# Patient Record
Sex: Female | Born: 2000 | Race: Black or African American | Hispanic: No | Marital: Single | Smoking: Never smoker
Health system: Southern US, Community
[De-identification: ages and names within clinical notes are randomized; demographics above are authoritative.]

## PROBLEM LIST (undated history)

## (undated) DIAGNOSIS — R569 Unspecified convulsions: Secondary | ICD-10-CM

## (undated) DIAGNOSIS — F79 Unspecified intellectual disabilities: Secondary | ICD-10-CM

## (undated) DIAGNOSIS — L309 Dermatitis, unspecified: Secondary | ICD-10-CM

## (undated) DIAGNOSIS — D649 Anemia, unspecified: Secondary | ICD-10-CM

## (undated) HISTORY — DX: Unspecified intellectual disabilities: F79

## (undated) HISTORY — DX: Dermatitis, unspecified: L30.9

## (undated) HISTORY — DX: Unspecified convulsions: R56.9

## (undated) HISTORY — DX: Anemia, unspecified: D64.9

---
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
## 2023-05-17 ENCOUNTER — Inpatient Hospital Stay: Payer: MEDICAID | Attending: Hematology and Oncology | Admitting: Hematology and Oncology

## 2023-05-17 ENCOUNTER — Encounter: Payer: Self-pay | Admitting: Hematology and Oncology

## 2023-05-17 ENCOUNTER — Inpatient Hospital Stay: Payer: MEDICAID

## 2023-05-17 VITALS — BP 123/75 | HR 108 | Temp 97.8°F | Resp 18

## 2023-05-17 DIAGNOSIS — F71 Moderate intellectual disabilities: Secondary | ICD-10-CM | POA: Diagnosis not present

## 2023-05-17 DIAGNOSIS — N924 Excessive bleeding in the premenopausal period: Secondary | ICD-10-CM

## 2023-05-17 DIAGNOSIS — D539 Nutritional anemia, unspecified: Secondary | ICD-10-CM

## 2023-05-17 DIAGNOSIS — N920 Excessive and frequent menstruation with regular cycle: Secondary | ICD-10-CM | POA: Diagnosis not present

## 2023-05-17 DIAGNOSIS — D75838 Other thrombocytosis: Secondary | ICD-10-CM | POA: Diagnosis not present

## 2023-05-17 DIAGNOSIS — Z79899 Other long term (current) drug therapy: Secondary | ICD-10-CM | POA: Insufficient documentation

## 2023-05-17 DIAGNOSIS — D500 Iron deficiency anemia secondary to blood loss (chronic): Secondary | ICD-10-CM

## 2023-05-17 LAB — ABO/RH: ABO/RH(D): O POS

## 2023-05-17 LAB — CBC WITH DIFFERENTIAL (CANCER CENTER ONLY)
Abs Immature Granulocytes: 0.01 10*3/uL (ref 0.00–0.07)
Basophils Absolute: 0.1 10*3/uL (ref 0.0–0.1)
Basophils Relative: 1 %
Eosinophils Absolute: 0.2 10*3/uL (ref 0.0–0.5)
Eosinophils Relative: 4 %
HCT: 24.9 % — ABNORMAL LOW (ref 36.0–46.0)
Hemoglobin: 6.7 g/dL — CL (ref 12.0–15.0)
Immature Granulocytes: 0 %
Lymphocytes Relative: 28 %
Lymphs Abs: 1.6 10*3/uL (ref 0.7–4.0)
MCH: 17.5 pg — ABNORMAL LOW (ref 26.0–34.0)
MCHC: 26.9 g/dL — ABNORMAL LOW (ref 30.0–36.0)
MCV: 65.2 fL — ABNORMAL LOW (ref 80.0–100.0)
Monocytes Absolute: 0.7 10*3/uL (ref 0.1–1.0)
Monocytes Relative: 13 %
Neutro Abs: 3 10*3/uL (ref 1.7–7.7)
Neutrophils Relative %: 54 %
Platelet Count: 483 10*3/uL — ABNORMAL HIGH (ref 150–400)
RBC: 3.82 MIL/uL — ABNORMAL LOW (ref 3.87–5.11)
RDW: 21.2 % — ABNORMAL HIGH (ref 11.5–15.5)
WBC Count: 5.5 10*3/uL (ref 4.0–10.5)
nRBC: 0 % (ref 0.0–0.2)

## 2023-05-17 LAB — SAMPLE TO BLOOD BANK

## 2023-05-17 LAB — CMP (CANCER CENTER ONLY)
ALT: 24 U/L (ref 0–44)
AST: 30 U/L (ref 15–41)
Albumin: 3.8 g/dL (ref 3.5–5.0)
Alkaline Phosphatase: 82 U/L (ref 38–126)
Anion gap: 6 (ref 5–15)
BUN: 6 mg/dL (ref 6–20)
CO2: 26 mmol/L (ref 22–32)
Calcium: 9.1 mg/dL (ref 8.9–10.3)
Chloride: 107 mmol/L (ref 98–111)
Creatinine: 0.41 mg/dL — ABNORMAL LOW (ref 0.44–1.00)
GFR, Estimated: >60 mL/min (ref >60)
Glucose, Bld: 80 mg/dL (ref 70–99)
Potassium: 3.8 mmol/L (ref 3.5–5.1)
Sodium: 139 mmol/L (ref 135–145)
Total Bilirubin: 0.2 mg/dL (ref <1.2)
Total Protein: 7.5 g/dL (ref 6.5–8.1)

## 2023-05-17 LAB — PREPARE RBC (CROSSMATCH)

## 2023-05-17 NOTE — Assessment & Plan Note (Addendum)
The cause of her iron deficiency anemia is due to menorrhagia Due to her history of seizure and severe anemia, I recommend blood transfusion followed by IV iron  We discussed some of the risks, benefits, and alternatives of blood transfusions. The patient is symptomatic from anemia and the hemoglobin level is critically low.  Some of the side-effects to be expected including risks of transfusion reactions, chills, infection, syndrome of volume overload and risk of hospitalization from various reasons and the patient's caregiver is willing to proceed and went ahead to sign consent today. I will arrange for 1 unit of blood tomorrow and IV iron infusion next month.  The most likely cause of her anemia is due to chronic blood loss/malabsorption syndrome. We discussed some of the risks, benefits, and alternatives of intravenous iron infusions. The patient is symptomatic from anemia and the iron level is critically low. She tolerated oral iron supplement poorly and desires to achieved higher levels of iron faster for adequate hematopoesis. Some of the side-effects to be expected including risks of infusion reactions, phlebitis, headaches, nausea and fatigue.  The patient's caregiver is willing to proceed. Patient education material was dispensed.  Goal is to keep ferritin level greater than 50 and resolution of anemia I plan to repeat labs again after she completes intravenous iron infusion

## 2023-05-17 NOTE — Assessment & Plan Note (Signed)
Likely due to severe iron deficiency anemia Observe closely I anticipate improvement in the future

## 2023-05-17 NOTE — Assessment & Plan Note (Signed)
I discussed risk and benefits of Lupron injection to stop her menstrual cycle with her caregiver, mother She ultimately agreed to proceed After Lupron injection, she can stop birth control pill In the future, we might want to consider hysterectomy

## 2023-05-17 NOTE — Assessment & Plan Note (Signed)
The patient is not able to give consent Her mother is the principal caregiver She has consented for blood, IV iron and Lupron injection Due to her frequent changes in posture, I have requested special accommodation for her to be transfused in an isolated room for privacy

## 2023-05-17 NOTE — Progress Notes (Signed)
[name] CONSULT NOTE  Patient Care Team: Christel Mormon, MD as PCP - General (Pediatrics)  ASSESSMENT & PLAN:  Iron deficiency anemia due to chronic blood loss The cause of her iron deficiency anemia is due to menorrhagia Due to her history of seizure and severe anemia, I recommend blood transfusion followed by IV iron  We discussed some of the risks, benefits, and alternatives of blood transfusions. The patient is symptomatic from anemia and the hemoglobin level is critically low.  Some of the side-effects to be expected including risks of transfusion reactions, chills, infection, syndrome of volume overload and risk of hospitalization from various reasons and the patient's caregiver is willing to proceed and went ahead to sign consent today. I will arrange for 1 unit of blood tomorrow and IV iron infusion next month.  The most likely cause of her anemia is due to chronic blood loss/malabsorption syndrome. We discussed some of the risks, benefits, and alternatives of intravenous iron infusions. The patient is symptomatic from anemia and the iron level is critically low. She tolerated oral iron supplement poorly and desires to achieved higher levels of iron faster for adequate hematopoesis. Some of the side-effects to be expected including risks of infusion reactions, phlebitis, headaches, nausea and fatigue.  The patient's caregiver is willing to proceed. Patient education material was dispensed.  Goal is to keep ferritin level greater than 50 and resolution of anemia I plan to repeat labs again after she completes intravenous iron infusion   Menorrhagia, premenopausal I discussed risk and benefits of Lupron injection to stop her menstrual cycle with her caregiver, mother She ultimately agreed to proceed After Lupron injection, she can stop birth control pill In the future, we might want to consider hysterectomy  Moderate intellectual disabilities The patient is not  able to give consent Her mother is the principal caregiver She has consented for blood, IV iron and Lupron injection Due to her frequent changes in posture, I have requested special accommodation for her to be transfused in an isolated room for privacy  Reactive thrombocytosis Likely due to severe iron deficiency anemia Observe closely I anticipate improvement in the future Orders Placed This Encounter  Procedures   CBC with Differential (Cancer Center Only)    Standing Status:   Future    Number of Occurrences:   1    Standing Expiration Date:   05/16/2024   CMP (Cancer Center only)    Standing Status:   Future    Number of Occurrences:   1    Standing Expiration Date:   05/16/2024   Informed Consent Details: Physician/Practitioner Attestation; Transcribe to consent form and obtain patient signature    Standing Status:   Future    Standing Expiration Date:   05/16/2024    Order Specific Question:   Physician/Practitioner attestation of informed consent for blood and or blood product transfusion    Answer:   I, the physician/practitioner, attest that I have discussed with the patient the benefits, risks, side effects, alternatives, likelihood of achieving goals and potential problems during recovery for the procedure that I have provided informed consent.    Order Specific Question:   Product(s)    Answer:   All Product(s)   Care order/instruction    Transfuse Parameters    Standing Status:   Future    Standing Expiration Date:   05/16/2024   ABO/Rh    Standing Status:   Future    Number of Occurrences:   1  Standing Expiration Date:   05/16/2024   Sample to Blood Bank    Standing Status:   Standing    Number of Occurrences:   33    Standing Expiration Date:   05/16/2024   Type and screen         Standing Status:   Future    Number of Occurrences:   1    Standing Expiration Date:   05/16/2024   Prepare RBC (crossmatch)    Standing Status:   Standing    Number of  Occurrences:   1    Order Specific Question:   # of Units    Answer:   1 unit    Order Specific Question:   Transfusion Indications    Answer:   Hemoglobin < 7 gm/dL and symptomatic    Order Specific Question:   Number of Units to Keep Ahead    Answer:   NO units ahead    Order Specific Question:   Instructions:    Answer:   Transfuse    Order Specific Question:   If emergent release call blood bank    Answer:   Not emergent release    All questions were answered. The patient knows to call the clinic with any problems, questions or concerns.  The total time spent in the appointment was 60 minutes encounter with patients including review of chart and various tests results, discussions about plan of care and coordination of care plan  Artis Delay, MD 11/25/20241:35 PM   CHIEF COMPLAINTS/PURPOSE OF CONSULTATION:  Anemia  HISTORY OF PRESENTING ILLNESS:  Samantha Benson 22 y.o. female is here because of anemia  She was found to have abnormal CBC from recent blood work On May 12, 2023, she was noted to have hemoglobin of 7.1, MCV of 66, ferritin of 7 and 2% saturation Repeat labs today showed worsening anemia Due to her intellectual disability, her mother provided majority of the history She has noted significant heavy menstruation over the past few months Unable to assess for symptoms of anemia No other signs of bleeding.  Her mother has been giving her intermittent doses of ibuprofen or acetaminophen as needed for pain She had no prior history or diagnosis of cancer. Her age appropriate screening programs are up-to-date. During the time of her menstrual cycle, the patient is noted to have poor oral intake She is not known to have received blood transfusion in the past.  She had history of seizures but no seizures in the past few years She was recently prescribed oral contraceptives to help with abnormal menstrual cycle.  She just completed menstrual cycle yesterday  MEDICAL  HISTORY:  Past Medical History:  Diagnosis Date   Anemia    Eczema    Intellectual disability    Seizures (HCC)     SURGICAL HISTORY: History reviewed. No pertinent surgical history.  SOCIAL HISTORY: Social History   Socioeconomic History   Marital status: Single    Spouse name: Not on file   Number of children: Not on file   Years of education: Not on file   Highest education level: Not on file  Occupational History   Not on file  Tobacco Use   Smoking status: Never   Smokeless tobacco: Never  Substance and Sexual Activity   Alcohol use: Not on file   Drug use: Not on file   Sexual activity: Not on file  Other Topics Concern   Not on file  Social History Narrative   She  attends [employer].   She lives with mom only.   She has one brother.   Social Determinants of Health   Financial Resource Strain: Not on File (10/09/2021)   Received from [facility]    Financial Resource Strain: 0  Food Insecurity: Not at Risk (05/12/2023)   Received from [employer] [facility]    Food: 1  Transportation Needs: Not at Risk (05/12/2023)   Received from [facility]    Transportation: 1  Physical Activity: Not on File (10/09/2021)   Received from [facility]   Physical Activity    Physical Activity: 0  Stress: Not on File (10/09/2021)   Received from [facility]   Stress    Stress: 0  Social Connections: Not on File (03/06/2023)   Received from [facility]   Social Connections    Connectedness: 0  Intimate Partner Violence: Not on file    FAMILY HISTORY: Family History  Problem Relation Age of Onset   Other Father        Seasonal Allergies    ALLERGIES:  is allergic to other.  MEDICATIONS:  Current Outpatient Medications  Medication Sig Dispense Refill   norethindrone-ethinyl estradiol (LOESTRIN) 1-20 MG-MCG tablet Take 1 tablet by mouth daily.     No current facility-administered medications for this visit.    REVIEW OF SYSTEMS:  Unable to assess  PHYSICAL EXAMINATION: ECOG PERFORMANCE STATUS: 1 - Symptomatic but completely ambulatory  Vitals:   05/17/23 1137  BP: 123/75  Pulse: (!) 108  Resp: 18  Temp: 97.8 F (36.6 C)   Filed Weights    GENERAL:alert, no distress and comfortable.  She appears to be rocking back and forth SKIN: skin color, texture, turgor are normal, no rashes or significant lesions EYES: normal, conjunctiva are pale and non-injected, sclera clear OROPHARYNX:no exudate, no erythema and lips, buccal mucosa, and tongue normal.  Noted poor dentition NECK: supple, thyroid normal size, non-tender, without nodularity LYMPH:  no palpable lymphadenopathy in the cervical, axillary or inguinal LUNGS: clear to auscultation and percussion with normal breathing effort HEART: Tachycardia, no murmurs and no lower extremity edema ABDOMEN:abdomen soft, non-tender and normal bowel sounds Musculoskeletal:no cyanosis of digits and no clubbing  PSYCH: alert NEURO: She had a brace on the right upper extremity.  On the left, noted contractures from prior stroke

## 2023-05-18 ENCOUNTER — Inpatient Hospital Stay: Payer: MEDICAID

## 2023-05-18 VITALS — BP 108/62 | HR 85 | Temp 97.6°F | Resp 12

## 2023-05-18 DIAGNOSIS — D500 Iron deficiency anemia secondary to blood loss (chronic): Secondary | ICD-10-CM

## 2023-05-18 DIAGNOSIS — D539 Nutritional anemia, unspecified: Secondary | ICD-10-CM

## 2023-05-18 MED ORDER — LEUPROLIDE ACETATE (3 MONTH) 11.25 MG IM KIT
11.2500 mg | PACK | Freq: Once | INTRAMUSCULAR | Status: AC
  Administered 2023-05-18: 11.25 mg via INTRAMUSCULAR
  Filled 2023-05-18: qty 11.25

## 2023-05-18 MED ORDER — DIPHENHYDRAMINE HCL 25 MG PO CAPS
25.0000 mg | ORAL_CAPSULE | Freq: Once | ORAL | Status: AC
  Administered 2023-05-18: 25 mg via ORAL
  Filled 2023-05-18: qty 1

## 2023-05-18 MED ORDER — SODIUM CHLORIDE 0.9% IV SOLUTION
250.0000 mL | INTRAVENOUS | Status: DC
Start: 2023-05-18 — End: 2023-05-18
  Administered 2023-05-18: 100 mL via INTRAVENOUS

## 2023-05-18 MED ORDER — ACETAMINOPHEN 325 MG PO TABS
650.0000 mg | ORAL_TABLET | Freq: Once | ORAL | Status: AC
Start: 2023-05-18 — End: 2023-05-18
  Administered 2023-05-18: 650 mg via ORAL
  Filled 2023-05-18: qty 2

## 2023-05-18 NOTE — Patient Instructions (Signed)
Blood Transfusion, Adult A blood transfusion is a procedure in which you receive blood or a type of blood cell (blood component) through an IV. You may need a blood transfusion when you have a low blood count, which is a low number of any blood cell. This may result from a bleeding disorder, illness, injury, or surgery. The blood may come from a donor, or you may be able to have your own blood collected and stored (autologous blood donation) before a planned surgery. The blood given in a transfusion may be made up of different blood components. You may receive: Red blood cells. These carry oxygen to the cells in the body. Platelets. These help your blood to clot. Plasma. This is the liquid part of your blood. It carries proteins and other substances throughout the body. White blood cells. These help you fight infections. If you have hemophilia or another clotting disorder, you may also receive other types of blood products. Depending on the type of blood product, this procedure may take 1-4 hours to complete. Tell a health care provider about: Any bleeding problems you have. Any previous reactions you have had during a blood transfusion. Any allergies you have. All medicines you are taking, including vitamins, herbs, eye drops, creams, and over-the-counter medicines. Any surgeries you have had. Any medical conditions you have. Whether you are pregnant or may be pregnant. What are the risks? Talk with your health care provider about risks. The most common problems include: A mild allergic reaction, such as red, swollen areas of skin (hives) and itching. Fever or chills. This may be the body's response to new blood cells received. This may occur during or up to 4 hours after the transfusion. More serious problems may include: A serious allergic reaction that causes difficulty breathing or swelling around the face and lips. Transfusion-associated circulatory overload (TACO), or too much fluid in  the lungs. This may cause breathing problems. Transfusion-related acute lung injury (TRALI), which causes breathing difficulty and low oxygen in the blood. This can occur within hours of the transfusion or several days later. Iron overload. This can happen after receiving many blood transfusions over a period of time. Infection or virus being transmitted. This is rare because donated blood is carefully tested before it is given. Hemolytic transfusion reaction. This is rare. It happens when the body's defense system (immune system)tries to attack the new blood cells. Symptoms may include fever, chills, nausea, low blood pressure, and low back or chest pain. Transfusion-associated graft-versus-host disease (TAGVHD). This is rare. It happens when donated cells attack the body's healthy tissues. What happens before the procedure? You will have a blood test to check your blood type. This test is done to know what kind of blood your body will accept and to match it to the donor blood. If you are going to have a planned surgery, you may be able to do an autologous blood donation. This may be done in case you need to have a transfusion. You will have your temperature, blood pressure, and pulse checked before the transfusion. If you have had an allergic reaction to a transfusion in the past, you may be given medicine to help prevent a reaction. This medicine may be given to you by mouth (orally) or through an IV. What happens during the procedure?  An IV will be inserted into one of your veins. The bag of blood will be attached to your IV. The blood will then enter through your vein. Your temperature, blood pressure,   and pulse will be monitored during the transfusion. This monitoring is done to detect early signs of a transfusion reaction. Tell your nurse right away if you have any of these symptoms during the transfusion: Shortness of breath or trouble breathing. Chest or back pain. Fever or  chills. Itching or hives. If you have any signs or symptoms of a reaction, your transfusion will be stopped and you may be given medicine. When the transfusion is complete, your IV will be removed. Pressure may be applied to the IV site for a few minutes. A bandage (dressing)will be applied. The procedure may vary among health care providers and hospitals. What happens after the procedure? Your temperature, blood pressure, pulse, breathing rate, and blood oxygen level will be monitored until you leave the hospital or clinic. Your blood may be tested to see how you have responded to the transfusion. You may be warmed with fluids or blankets to maintain a normal body temperature. If you receive your blood transfusion in an outpatient setting, you will be told whom to contact to report any reactions. Where to find more information Visit the American Red Cross: redcross.org Summary A blood transfusion is a procedure in which you receive blood or a type of blood cell (blood component) through an IV. The blood given in a transfusion may be made up of different blood components. You may receive red blood cells, platelets, plasma, or white blood cells depending on the condition treated. Your temperature, blood pressure, and pulse will be monitored before, during, and after the transfusion. After the transfusion, your blood may be tested to see how your body has responded. This information is not intended to replace advice given to you by your health care provider. Make sure you discuss any questions you have with your health care provider. Document Revised: 09/05/2021 Document Reviewed: 09/05/2021 Elsevier Patient Education  2024 Elsevier Inc.  

## 2023-05-21 LAB — BPAM RBC
Blood Product Expiration Date: 202412262359
ISSUE DATE / TIME: 202411261324
Unit Type and Rh: 5100

## 2023-05-21 LAB — TYPE AND SCREEN
ABO/RH(D): O POS
Antibody Screen: NEGATIVE
Unit division: 0

## 2023-05-31 ENCOUNTER — Other Ambulatory Visit: Payer: Self-pay | Admitting: Physician Assistant

## 2023-05-31 DIAGNOSIS — N920 Excessive and frequent menstruation with regular cycle: Secondary | ICD-10-CM

## 2023-06-01 ENCOUNTER — Inpatient Hospital Stay: Payer: MEDICAID | Attending: Hematology and Oncology

## 2023-06-01 ENCOUNTER — Other Ambulatory Visit: Payer: Self-pay

## 2023-06-01 ENCOUNTER — Telehealth: Payer: Self-pay

## 2023-06-01 VITALS — BP 98/67 | HR 78 | Temp 97.6°F | Resp 14

## 2023-06-01 DIAGNOSIS — D500 Iron deficiency anemia secondary to blood loss (chronic): Secondary | ICD-10-CM | POA: Diagnosis present

## 2023-06-01 DIAGNOSIS — N920 Excessive and frequent menstruation with regular cycle: Secondary | ICD-10-CM | POA: Diagnosis present

## 2023-06-01 MED ORDER — SODIUM CHLORIDE 0.9 % IV SOLN: INTRAVENOUS | Status: DC

## 2023-06-01 MED ORDER — SODIUM CHLORIDE 0.9 % IV SOLN
400.0000 mg | Freq: Once | INTRAVENOUS | Status: AC
  Administered 2023-06-01: 400 mg via INTRAVENOUS
  Filled 2023-06-01: qty 400

## 2023-06-01 NOTE — Progress Notes (Signed)
Patient tolerated her iron infusion with no significant signs of distress. VSS post infusion with a 30 minute observation.   BP 98/67 (BP Location: Left Arm, Patient Position: Sitting)   Pulse 78   Temp 97.6 F (36.4 C) (Tympanic)   Resp 14   SpO2 98%    Mother took her to the lobby in a [facility], as she arrived.

## 2023-06-01 NOTE — Patient Instructions (Signed)
Iron Sucrose Injection What is this medication? IRON SUCROSE (EYE ern SOO krose) treats low levels of iron (iron deficiency anemia) in people with kidney disease. Iron is a mineral that plays an important role in making red blood cells, which carry oxygen from your lungs to the rest of your body. This medicine may be used for other purposes; ask your health care provider or pharmacist if you have questions. COMMON BRAND NAME(S): Venofer What should I tell my care team before I take this medication? They need to know if you have any of these conditions: Anemia not caused by low iron levels Heart disease High levels of iron in the blood Kidney disease Liver disease An unusual or allergic reaction to iron, other medications, foods, dyes, or preservatives Pregnant or trying to get pregnant Breastfeeding How should I use this medication? This medication is for infusion into a vein. It is given in a hospital or clinic setting. Talk to your care team about the use of this medication in children. While this medication may be prescribed for children as young as 2 years for selected conditions, precautions do apply. Overdosage: If you think you have taken too much of this medicine contact a poison control center or emergency room at once. NOTE: This medicine is only for you. Do not share this medicine with others. What if I miss a dose? Keep appointments for follow-up doses. It is important not to miss your dose. Call your care team if you are unable to keep an appointment. What may interact with this medication? Do not take this medication with any of the following: Deferoxamine Dimercaprol Other iron products This medication may also interact with the following: Chloramphenicol Deferasirox This list may not describe all possible interactions. Give your health care provider a list of all the medicines, herbs, non-prescription drugs, or dietary supplements you use. Also tell them if you smoke,  drink alcohol, or use illegal drugs. Some items may interact with your medicine. What should I watch for while using this medication? Visit your care team regularly. Tell your care team if your symptoms do not start to get better or if they get worse. You may need blood work done while you are taking this medication. You may need to follow a special diet. Talk to your care team. Foods that contain iron include: whole grains/cereals, dried fruits, beans, or peas, leafy green vegetables, and organ meats (liver, kidney). What side effects may I notice from receiving this medication? Side effects that you should report to your care team as soon as possible: Allergic reactions--skin rash, itching, hives, swelling of the face, lips, tongue, or throat Low blood pressure--dizziness, feeling faint or lightheaded, blurry vision Shortness of breath Side effects that usually do not require medical attention (report to your care team if they continue or are bothersome): Flushing Headache Joint pain Muscle pain Nausea Pain, redness, or irritation at injection site This list may not describe all possible side effects. Call your doctor for medical advice about side effects. You may report side effects to FDA at 1-800-FDA-1088. Where should I keep my medication? This medication is given in a hospital or clinic. It will not be stored at home. NOTE: This sheet is a summary. It may not cover all possible information. If you have questions about this medicine, talk to your doctor, pharmacist, or health care provider.  2024 Elsevier/Gold Standard (2022-11-13 00:00:00)

## 2023-06-01 NOTE — Telephone Encounter (Signed)
Called regarding appt today. Spoke with Mom they are out front in the lobby now checking in for appt.

## 2023-06-08 ENCOUNTER — Telehealth: Payer: Self-pay

## 2023-06-08 ENCOUNTER — Inpatient Hospital Stay: Payer: MEDICAID

## 2023-06-08 ENCOUNTER — Inpatient Hospital Stay: Payer: MEDICAID | Admitting: Hematology and Oncology

## 2023-06-08 NOTE — Telephone Encounter (Signed)
Called and left a message asking Mom to call the office back about today's appts.

## 2023-06-08 NOTE — Telephone Encounter (Signed)
Called and spoke with Mom. Moved appts to tomorrow. Mom is aware of appts.

## 2023-06-09 ENCOUNTER — Encounter: Payer: Self-pay | Admitting: Hematology and Oncology

## 2023-06-09 ENCOUNTER — Inpatient Hospital Stay (HOSPITAL_BASED_OUTPATIENT_CLINIC_OR_DEPARTMENT_OTHER): Payer: MEDICAID | Admitting: Hematology and Oncology

## 2023-06-09 ENCOUNTER — Inpatient Hospital Stay: Payer: MEDICAID

## 2023-06-09 VITALS — BP 100/90 | HR 90 | Temp 98.3°F | Resp 18

## 2023-06-09 DIAGNOSIS — D500 Iron deficiency anemia secondary to blood loss (chronic): Secondary | ICD-10-CM

## 2023-06-09 DIAGNOSIS — F71 Moderate intellectual disabilities: Secondary | ICD-10-CM | POA: Diagnosis not present

## 2023-06-09 DIAGNOSIS — N924 Excessive bleeding in the premenopausal period: Secondary | ICD-10-CM | POA: Diagnosis not present

## 2023-06-09 MED ORDER — SODIUM CHLORIDE 0.9 % IV SOLN
400.0000 mg | Freq: Once | INTRAVENOUS | Status: AC
  Administered 2023-06-09: 400 mg via INTRAVENOUS
  Filled 2023-06-09: qty 400

## 2023-06-09 NOTE — Assessment & Plan Note (Signed)
The cause of her iron deficiency anemia is due to menorrhagia She has no further menstrual cycle since the Lupron injection I recommend 1 more dose of intravenous iron infusion  The most likely cause of her anemia is due to chronic blood loss/malabsorption syndrome. We discussed some of the risks, benefits, and alternatives of intravenous iron infusions. The patient is symptomatic from anemia and the iron level is critically low. She tolerated oral iron supplement poorly and desires to achieved higher levels of iron faster for adequate hematopoesis. Some of the side-effects to be expected including risks of infusion reactions, phlebitis, headaches, nausea and fatigue.  The patient's caregiver is willing to proceed. Patient education material was dispensed.  Goal is to keep ferritin level greater than 50 and resolution of anemia  I anticipate improvement of her blood count moving forward I will see her in February for further evaluation with repeat labs, Lupron injection and IV iron

## 2023-06-09 NOTE — Assessment & Plan Note (Signed)
She is doing well with Lupron injection

## 2023-06-09 NOTE — Progress Notes (Signed)
[facility] OFFICE PROGRESS NOTE  Coccaro, Althea Grimmer, MD  ASSESSMENT & PLAN:  Iron deficiency anemia due to chronic blood loss The cause of her iron deficiency anemia is due to menorrhagia She has no further menstrual cycle since the Lupron injection I recommend 1 more dose of intravenous iron infusion  The most likely cause of her anemia is due to chronic blood loss/malabsorption syndrome. We discussed some of the risks, benefits, and alternatives of intravenous iron infusions. The patient is symptomatic from anemia and the iron level is critically low. She tolerated oral iron supplement poorly and desires to achieved higher levels of iron faster for adequate hematopoesis. Some of the side-effects to be expected including risks of infusion reactions, phlebitis, headaches, nausea and fatigue.  The patient's caregiver is willing to proceed. Patient education material was dispensed.  Goal is to keep ferritin level greater than 50 and resolution of anemia  I anticipate improvement of her blood count moving forward I will see her in February for further evaluation with repeat labs, Lupron injection and IV iron  Moderate intellectual disabilities The patient is not able to give consent Her mother is the principal caregiver She has consented for IV iron  Due to her frequent changes in posture, I have requested special accommodation for her to be transfused in an isolated room for privacy  Menorrhagia, premenopausal She is doing well with Lupron injection  Orders Placed This Encounter  Procedures   Ferritin    Standing Status:   Future    Expiration Date:   06/08/2024   Iron and Iron Binding Capacity ([facility] only)    Standing Status:   Future    Expiration Date:   06/08/2024   CBC with Differential ([facility]cer Center Only)    Standing Status:   Future    Expiration Date:   06/08/2024   CMP (Cancer Center only)    Standing Status:   Future    Expiration Date:    06/08/2024    The total time spent in the appointment was 25 minutes encounter with patients including review of chart and various tests results, discussions about plan of care and coordination of care plan   All questions were answered. The patient knows to call the clinic with any problems, questions or concerns. No barriers to learning was detected.    Artis Delay, MD 12/18/202412:34 PM  INTERVAL HISTORY: Samantha Benson 22 y.o. female returns for further follow-up for severe iron deficiency anemia with menorrhagia She is here accompanied by her mother She appears to tolerate recent treatment well She did not have any further menstrual cycle since last month  SUMMARY OF HEMATOLOGIC HISTORY:  She was found to have abnormal CBC from recent blood work On May 12, 2023, she was noted to have hemoglobin of 7.1, MCV of 66, ferritin of 7 and 2% saturation Repeat labs today showed worsening anemia Due to her intellectual disability, her mother provided majority of the history She has noted significant heavy menstruation over the past few months Unable to assess for symptoms of anemia No other signs of bleeding.  Her mother has been giving her intermittent doses of ibuprofen or acetaminophen as needed for pain She had no prior history or diagnosis of cancer. Her age appropriate screening programs are up-to-date. During the time of her menstrual cycle, the patient is noted to have poor oral intake She is not known to have received blood transfusion in the past.  She had history of seizures  but no seizures in the past few years She was recently prescribed oral contraceptives to help with abnormal menstrual cycle.  She just completed menstrual cycle yesterday On May 17, 2023, she received blood, IV iron and Lupron injection  I have reviewed the past medical history, past surgical history, social history and family history with the patient and they are unchanged from previous  note.  ALLERGIES:  is allergic to other.  MEDICATIONS:  No current outpatient medications on file.   No current facility-administered medications for this visit.   Facility-Administered Medications Ordered in Other Visits  Medication Dose Route Frequency Provider Last Rate Last Admin   iron sucrose (VENOFER) 400 mg in sodium chloride 0.9 % 250 mL IVPB  400 mg Intravenous Once Bertis Ruddy, Krishay Faro, MD         REVIEW OF SYSTEMS:   Constitutional: Denies fevers, chills or night sweats Eyes: Denies blurriness of vision Ears, nose, mouth, throat, and face: Denies mucositis or sore throat Respiratory: Denies cough, dyspnea or wheezes Cardiovascular: Denies palpitation, chest discomfort or lower extremity swelling Gastrointestinal:  Denies nausea, heartburn or change in bowel habits Skin: Denies abnormal skin rashes Lymphatics: Denies new lymphadenopathy or easy bruising Neurological:Denies numbness, tingling or new weaknesses Behavioral/Psych: Mood is stable, no new changes  All other systems were reviewed with the patient and are negative.  PHYSICAL EXAMINATION: ECOG PERFORMANCE STATUS: 1 - Symptomatic but completely ambulatory  Vitals:   06/09/23 1131  BP: (!) 100/90  Pulse: 90  Resp: 18  Temp: 98.3 F (36.8 C)  SpO2: 94%   Filed Weights    GENERAL:alert, no distress and comfortable  LABORATORY DATA:  I have reviewed the data as listed     Component Value Date/Time   NA 139 05/17/2023 1214   K 3.8 05/17/2023 1214   CL 107 05/17/2023 1214   CO2 26 05/17/2023 1214   GLUCOSE 80 05/17/2023 1214   BUN 6 05/17/2023 1214   CREATININE 0.41 (L) 05/17/2023 1214   CALCIUM 9.1 05/17/2023 1214   PROT 7.5 05/17/2023 1214   ALBUMIN 3.8 05/17/2023 1214   AST 30 05/17/2023 1214   ALT 24 05/17/2023 1214   ALKPHOS 82 05/17/2023 1214   BILITOT 0.2 05/17/2023 1214   GFRNONAA >60 05/17/2023 1214    No results found for: "SPEP", "UPEP"  Lab Results  Component Value Date   WBC 5.5  05/17/2023   NEUTROABS 3.0 05/17/2023   HGB 6.7 (LL) 05/17/2023   HCT 24.9 (L) 05/17/2023   MCV 65.2 (L) 05/17/2023   PLT 483 (H) 05/17/2023      Chemistry      Component Value Date/Time   NA 139 05/17/2023 1214   K 3.8 05/17/2023 1214   CL 107 05/17/2023 1214   CO2 26 05/17/2023 1214   BUN 6 05/17/2023 1214   CREATININE 0.41 (L) 05/17/2023 1214      Component Value Date/Time   CALCIUM 9.1 05/17/2023 1214   ALKPHOS 82 05/17/2023 1214   AST 30 05/17/2023 1214   ALT 24 05/17/2023 1214   BILITOT 0.2 05/17/2023 1214

## 2023-06-09 NOTE — Assessment & Plan Note (Signed)
The patient is not able to give consent Her mother is the principal caregiver She has consented for IV iron  Due to her frequent changes in posture, I have requested special accommodation for her to be transfused in an isolated room for privacy

## 2023-06-10 ENCOUNTER — Telehealth: Payer: Self-pay

## 2023-06-10 NOTE — Telephone Encounter (Signed)
Called regarding after hours call from Mom. Clarified Mom's message. Samantha Benson had a few spots of vaginal blood yesterday and today. She is doing good.  Mom is asking if Samantha Benson even needs the ultrasound of the pelvis that was ordered by PCP prior to Lupron injection. She is not wanting to do the ultrasound. She is asking what Dr. Bertis Ruddy recommends.

## 2023-06-10 NOTE — Telephone Encounter (Signed)
I am not worried about spotting Call back if patient has heavy menstrual flow We can hold off [place] for now

## 2023-06-10 NOTE — Telephone Encounter (Signed)
Called Mom and given below message. She verbalized understanding and will call the office back for questions/concerns.

## 2023-08-10 ENCOUNTER — Telehealth: Payer: Self-pay

## 2023-08-10 NOTE — Telephone Encounter (Signed)
Called regarding appt on 2/20 and if they planned on keeping appt due to possible snow tomorrow. Mom will call the office back tomorrow if appt needs to be canceled.

## 2023-08-10 NOTE — Telephone Encounter (Signed)
 Sent LOS

## 2023-08-10 NOTE — Telephone Encounter (Signed)
Returned call to [employer]. She is concerned about the weather and canceling appts on 2/20. Appts canceled.

## 2023-08-12 ENCOUNTER — Other Ambulatory Visit: Payer: MEDICAID

## 2023-08-12 ENCOUNTER — Ambulatory Visit: Payer: MEDICAID

## 2023-08-12 ENCOUNTER — Ambulatory Visit: Payer: MEDICAID | Admitting: Hematology and Oncology

## 2023-08-13 ENCOUNTER — Telehealth: Payer: Self-pay | Admitting: Hematology and Oncology

## 2023-08-13 NOTE — Telephone Encounter (Signed)
 Marland Kitchen

## 2023-08-27 ENCOUNTER — Encounter: Payer: Self-pay | Admitting: Hematology and Oncology

## 2023-08-27 ENCOUNTER — Other Ambulatory Visit: Payer: Self-pay | Admitting: Hematology and Oncology

## 2023-08-27 ENCOUNTER — Inpatient Hospital Stay: Payer: MEDICAID

## 2023-08-27 ENCOUNTER — Inpatient Hospital Stay: Payer: MEDICAID | Attending: Hematology and Oncology

## 2023-08-27 ENCOUNTER — Inpatient Hospital Stay: Payer: MEDICAID | Admitting: Hematology and Oncology

## 2023-08-27 VITALS — BP 94/44 | HR 88 | Temp 97.7°F | Resp 18

## 2023-08-27 DIAGNOSIS — N924 Excessive bleeding in the premenopausal period: Secondary | ICD-10-CM | POA: Diagnosis not present

## 2023-08-27 DIAGNOSIS — N920 Excessive and frequent menstruation with regular cycle: Secondary | ICD-10-CM | POA: Diagnosis present

## 2023-08-27 DIAGNOSIS — Z79899 Other long term (current) drug therapy: Secondary | ICD-10-CM | POA: Diagnosis not present

## 2023-08-27 DIAGNOSIS — D500 Iron deficiency anemia secondary to blood loss (chronic): Secondary | ICD-10-CM

## 2023-08-27 DIAGNOSIS — D75838 Other thrombocytosis: Secondary | ICD-10-CM | POA: Insufficient documentation

## 2023-08-27 DIAGNOSIS — D539 Nutritional anemia, unspecified: Secondary | ICD-10-CM

## 2023-08-27 LAB — CMP (CANCER CENTER ONLY)
ALT: 42 U/L (ref 0–44)
AST: 36 U/L (ref 15–41)
Albumin: 4.6 g/dL (ref 3.5–5.0)
Alkaline Phosphatase: 87 U/L (ref 38–126)
Anion gap: 5 (ref 5–15)
BUN: 12 mg/dL (ref 6–20)
CO2: 30 mmol/L (ref 22–32)
Calcium: 9.5 mg/dL (ref 8.9–10.3)
Chloride: 103 mmol/L (ref 98–111)
Creatinine: 0.47 mg/dL (ref 0.44–1.00)
GFR, Estimated: >60 mL/min (ref >60)
Glucose, Bld: 94 mg/dL (ref 70–99)
Potassium: 4 mmol/L (ref 3.5–5.1)
Sodium: 138 mmol/L (ref 135–145)
Total Bilirubin: 0.2 mg/dL (ref 0.0–1.2)
Total Protein: 8.1 g/dL (ref 6.5–8.1)

## 2023-08-27 LAB — CBC WITH DIFFERENTIAL (CANCER CENTER ONLY)
Abs Immature Granulocytes: 0.01 10*3/uL (ref 0.00–0.07)
Basophils Absolute: 0 10*3/uL (ref 0.0–0.1)
Basophils Relative: 1 %
Eosinophils Absolute: 0.2 10*3/uL (ref 0.0–0.5)
Eosinophils Relative: 4 %
HCT: 38.6 % (ref 36.0–46.0)
Hemoglobin: 12.1 g/dL (ref 12.0–15.0)
Immature Granulocytes: 0 %
Lymphocytes Relative: 29 %
Lymphs Abs: 1.3 10*3/uL (ref 0.7–4.0)
MCH: 25.2 pg — ABNORMAL LOW (ref 26.0–34.0)
MCHC: 31.3 g/dL (ref 30.0–36.0)
MCV: 80.4 fL (ref 80.0–100.0)
Monocytes Absolute: 0.5 10*3/uL (ref 0.1–1.0)
Monocytes Relative: 10 %
Neutro Abs: 2.7 10*3/uL (ref 1.7–7.7)
Neutrophils Relative %: 56 %
Platelet Count: 271 10*3/uL (ref 150–400)
RBC: 4.8 MIL/uL (ref 3.87–5.11)
RDW: 18.6 % — ABNORMAL HIGH (ref 11.5–15.5)
WBC Count: 4.7 10*3/uL (ref 4.0–10.5)
nRBC: 0 % (ref 0.0–0.2)

## 2023-08-27 LAB — IRON AND IRON BINDING CAPACITY (CC-WL,HP ONLY)
Iron: 86 ug/dL (ref 28–170)
Saturation Ratios: 23 % (ref 10.4–31.8)
TIBC: 375 ug/dL (ref 250–450)
UIBC: 289 ug/dL (ref 148–442)

## 2023-08-27 LAB — SAMPLE TO BLOOD BANK

## 2023-08-27 LAB — FERRITIN: Ferritin: 101 ng/mL (ref 11–307)

## 2023-08-27 MED ORDER — LEUPROLIDE ACETATE (3 MONTH) 11.25 MG IM KIT
11.2500 mg | PACK | Freq: Once | INTRAMUSCULAR | Status: DC
  Filled 2023-08-27: qty 11.25

## 2023-08-27 MED ORDER — LEUPROLIDE ACETATE (3 MONTH) 11.25 MG IM KIT
11.2500 mg | PACK | Freq: Once | INTRAMUSCULAR | Status: AC
  Administered 2023-08-27: 11.25 mg via INTRAMUSCULAR
  Filled 2023-08-27: qty 11.25

## 2023-08-27 NOTE — Assessment & Plan Note (Signed)
 This has resolved.

## 2023-08-27 NOTE — Assessment & Plan Note (Signed)
 She has no recent menstrual cycle after Lupron injection

## 2023-08-27 NOTE — Assessment & Plan Note (Signed)
 She is no longer anemic Her prior treatment with Lupron was effective to stop her menstrual cycle and the IV iron has improve her blood count all the way to normal After long discussion with her mother, we will continue Lupron injection every 3 months

## 2023-08-27 NOTE — Progress Notes (Signed)
   [facility] OFFICE PROGRESS NOTE  Coccaro, Althea Grimmer, MD  ASSESSMENT & PLAN:  Iron deficiency anemia due to chronic blood loss She is no longer anemic Her prior treatment with Lupron was effective to stop her menstrual cycle and the IV iron has improve her blood count all the way to normal After long discussion with her mother, we will continue Lupron injection every 3 months  Reactive thrombocytosis This has resolved  Menorrhagia, premenopausal She has no recent menstrual cycle after Lupron injection  Orders Placed This Encounter  Procedures   Comprehensive metabolic panel    Standing Status:   Standing    Number of Occurrences:   33    Expiration Date:   08/26/2024   CBC with Differential/Platelet    Standing Status:   Standing    Number of Occurrences:   22    Expiration Date:   08/26/2024   Ferritin    Standing Status:   Standing    Number of Occurrences:   4    Expiration Date:   08/26/2024   Iron and Iron Binding Capacity ([facility] only)    Standing Status:   Standing    Number of Occurrences:   4    Expiration Date:   08/26/2024    The total time spent in the appointment was 20 minutes encounter with patients including review of chart and various tests results, discussions about plan of care and coordination of care plan   All questions were answered. The patient knows to call the clinic with any problems, questions or concerns. No barriers to learning was detected.    Artis Delay, MD 3/7/20255:34 PM  INTERVAL HISTORY: Samantha Benson 23 y.o. female returns for further follow-up for iron deficiency anemia due to menorrhagia Her mother provided most of the history She has no recent menstrual cycle We discussed CBC results  SUMMARY OF HEMATOLOGIC HISTORY:  She was found to have abnormal CBC from recent blood work On May 12, 2023, she was noted to have hemoglobin of 7.1, MCV of 66, ferritin of 7 and 2% saturation Repeat labs today showed  worsening anemia Due to her intellectual disability, her mother provided majority of the history She has noted significant heavy menstruation over the past few months Unable to assess for symptoms of anemia No other signs of bleeding.  Her mother has been giving her intermittent doses of ibuprofen or acetaminophen as needed for pain She had no prior history or diagnosis of cancer. Her age appropriate screening programs are up-to-date. During the time of her menstrual cycle, the patient is noted to have poor oral intake She is not known to have received blood transfusion in the past.  She had history of seizures but no seizures in the past few years She was recently prescribed oral contraceptives to help with abnormal menstrual cycle.  She just completed menstrual cycle yesterday On May 17, 2023, she received blood, IV iron and Lupron injection

## 2023-08-28 ENCOUNTER — Ambulatory Visit: Payer: MEDICAID

## 2023-08-30 ENCOUNTER — Inpatient Hospital Stay: Payer: MEDICAID

## 2023-11-25 ENCOUNTER — Encounter: Payer: Self-pay | Admitting: Hematology and Oncology

## 2023-11-25 ENCOUNTER — Inpatient Hospital Stay (HOSPITAL_BASED_OUTPATIENT_CLINIC_OR_DEPARTMENT_OTHER): Payer: MEDICAID | Admitting: Hematology and Oncology

## 2023-11-25 ENCOUNTER — Inpatient Hospital Stay: Payer: MEDICAID | Attending: Hematology and Oncology

## 2023-11-25 ENCOUNTER — Inpatient Hospital Stay: Payer: MEDICAID

## 2023-11-25 VITALS — BP 90/62 | HR 85 | Temp 98.0°F | Resp 18

## 2023-11-25 DIAGNOSIS — N920 Excessive and frequent menstruation with regular cycle: Secondary | ICD-10-CM | POA: Diagnosis present

## 2023-11-25 DIAGNOSIS — D500 Iron deficiency anemia secondary to blood loss (chronic): Secondary | ICD-10-CM

## 2023-11-25 DIAGNOSIS — Z79899 Other long term (current) drug therapy: Secondary | ICD-10-CM | POA: Insufficient documentation

## 2023-11-25 DIAGNOSIS — N924 Excessive bleeding in the premenopausal period: Secondary | ICD-10-CM

## 2023-11-25 DIAGNOSIS — D75838 Other thrombocytosis: Secondary | ICD-10-CM

## 2023-11-25 DIAGNOSIS — D539 Nutritional anemia, unspecified: Secondary | ICD-10-CM

## 2023-11-25 LAB — COMPREHENSIVE METABOLIC PANEL WITH GFR
ALT: 22 U/L (ref 0–44)
AST: 22 U/L (ref 15–41)
Albumin: 4.4 g/dL (ref 3.5–5.0)
Alkaline Phosphatase: 84 U/L (ref 38–126)
Anion gap: 5 (ref 5–15)
BUN: 11 mg/dL (ref 6–20)
CO2: 30 mmol/L (ref 22–32)
Calcium: 9.6 mg/dL (ref 8.9–10.3)
Chloride: 104 mmol/L (ref 98–111)
Creatinine, Ser: 0.5 mg/dL (ref 0.44–1.00)
GFR, Estimated: >60 mL/min (ref >60)
Glucose, Bld: 92 mg/dL (ref 70–99)
Potassium: 3.7 mmol/L (ref 3.5–5.1)
Sodium: 139 mmol/L (ref 135–145)
Total Bilirubin: 0.2 mg/dL (ref 0.0–1.2)
Total Protein: 8 g/dL (ref 6.5–8.1)

## 2023-11-25 LAB — FERRITIN: Ferritin: 160 ng/mL (ref 11–307)

## 2023-11-25 LAB — CBC WITH DIFFERENTIAL/PLATELET
Abs Immature Granulocytes: 0.01 10*3/uL (ref 0.00–0.07)
Basophils Absolute: 0 10*3/uL (ref 0.0–0.1)
Basophils Relative: 1 %
Eosinophils Absolute: 0.2 10*3/uL (ref 0.0–0.5)
Eosinophils Relative: 4 %
HCT: 38.1 % (ref 36.0–46.0)
Hemoglobin: 12.1 g/dL (ref 12.0–15.0)
Immature Granulocytes: 0 %
Lymphocytes Relative: 28 %
Lymphs Abs: 1.3 10*3/uL (ref 0.7–4.0)
MCH: 26.4 pg (ref 26.0–34.0)
MCHC: 31.8 g/dL (ref 30.0–36.0)
MCV: 83.2 fL (ref 80.0–100.0)
Monocytes Absolute: 0.5 10*3/uL (ref 0.1–1.0)
Monocytes Relative: 11 %
Neutro Abs: 2.6 10*3/uL (ref 1.7–7.7)
Neutrophils Relative %: 56 %
Platelets: 199 10*3/uL (ref 150–400)
RBC: 4.58 MIL/uL (ref 3.87–5.11)
RDW: 12.6 % (ref 11.5–15.5)
WBC: 4.6 10*3/uL (ref 4.0–10.5)
nRBC: 0 % (ref 0.0–0.2)

## 2023-11-25 LAB — SAMPLE TO BLOOD BANK

## 2023-11-25 LAB — IRON AND IRON BINDING CAPACITY (CC-WL,HP ONLY)
Iron: 92 ug/dL (ref 28–170)
Saturation Ratios: 26 % (ref 10.4–31.8)
TIBC: 356 ug/dL (ref 250–450)
UIBC: 264 ug/dL (ref 148–442)

## 2023-11-25 MED ORDER — LEUPROLIDE ACETATE (3 MONTH) 11.25 MG IM KIT
11.2500 mg | PACK | Freq: Once | INTRAMUSCULAR | Status: AC
  Administered 2023-11-25: 11.25 mg via INTRAMUSCULAR
  Filled 2023-11-25: qty 11.25

## 2023-11-25 NOTE — Assessment & Plan Note (Addendum)
 She is no longer anemic Her prior treatment with Lupron  was effective to stop her menstrual cycle and the IV iron  has improve her blood count all the way to normal She does not need intravenous iron  infusion After long discussion with her mother, we will continue Lupron  injection every 3 months

## 2023-11-25 NOTE — Progress Notes (Signed)
   [facility] OFFICE PROGRESS NOTE  Coccaro, Rockie Churchman, MD  ASSESSMENT & PLAN:  Assessment & Plan Iron  deficiency anemia due to chronic blood loss She is no longer anemic Her prior treatment with Lupron  was effective to stop her menstrual cycle and the IV iron  has improve her blood count all the way to normal She does not need intravenous iron  infusion After long discussion with her mother, we will continue Lupron  injection every 3 months    No orders of the defined types were placed in this encounter.   INTERVAL HISTORY: Patient returns for recurrent anemia Symptoms of anemia includes none We reviewed CBC and iron  studies result  SUMMARY OF HEMATOLOGIC HISTORY:  She was found to have abnormal CBC from recent blood work On May 12, 2023, she was noted to have hemoglobin of 7.1, MCV of 66, ferritin of 7 and 2% saturation Repeat labs today showed worsening anemia Due to her intellectual disability, her mother provided majority of the history She has noted significant heavy menstruation over the past few months Unable to assess for symptoms of anemia No other signs of bleeding.  Her mother has been giving her intermittent doses of ibuprofen or acetaminophen  as needed for pain She had no prior history or diagnosis of cancer. Her age appropriate screening programs are up-to-date. During the time of her menstrual cycle, the patient is noted to have poor oral intake She is not known to have received blood transfusion in the past.  She had history of seizures but no seizures in the past few years She was recently prescribed oral contraceptives to help with abnormal menstrual cycle.  She just completed menstrual cycle yesterday On May 17, 2023, she received blood, IV iron  and Lupron  injection Starting 2025, the patient has not needed blood IV iron .  She received Lupron  injection every 3 months to stop her menstrual cycle  Vitals:   11/25/23 1117  BP: 90/62   Pulse: 85  Resp: 18  Temp: 98 F (36.7 C)  SpO2: 100%

## 2023-12-01 ENCOUNTER — Encounter: Payer: Self-pay | Admitting: Hematology and Oncology

## 2024-02-25 ENCOUNTER — Encounter: Payer: Self-pay | Admitting: Hematology and Oncology

## 2024-02-25 ENCOUNTER — Inpatient Hospital Stay: Payer: MEDICAID

## 2024-02-25 ENCOUNTER — Inpatient Hospital Stay: Payer: MEDICAID | Attending: Hematology and Oncology

## 2024-02-25 ENCOUNTER — Inpatient Hospital Stay: Payer: MEDICAID | Admitting: Hematology and Oncology

## 2024-04-03 ENCOUNTER — Inpatient Hospital Stay: Payer: MEDICAID | Attending: Hematology and Oncology

## 2024-04-03 ENCOUNTER — Encounter: Payer: Self-pay | Admitting: Hematology and Oncology

## 2024-04-03 ENCOUNTER — Inpatient Hospital Stay: Payer: MEDICAID | Admitting: Hematology and Oncology

## 2024-04-03 ENCOUNTER — Inpatient Hospital Stay: Payer: MEDICAID

## 2024-04-03 VITALS — HR 52 | Temp 99.0°F | Resp 17

## 2024-04-03 DIAGNOSIS — D500 Iron deficiency anemia secondary to blood loss (chronic): Secondary | ICD-10-CM | POA: Insufficient documentation

## 2024-04-03 DIAGNOSIS — Z23 Encounter for immunization: Secondary | ICD-10-CM

## 2024-04-03 DIAGNOSIS — F71 Moderate intellectual disabilities: Secondary | ICD-10-CM | POA: Diagnosis not present

## 2024-04-03 DIAGNOSIS — N920 Excessive and frequent menstruation with regular cycle: Secondary | ICD-10-CM | POA: Insufficient documentation

## 2024-04-03 DIAGNOSIS — D75838 Other thrombocytosis: Secondary | ICD-10-CM

## 2024-04-03 DIAGNOSIS — N924 Excessive bleeding in the premenopausal period: Secondary | ICD-10-CM

## 2024-04-03 DIAGNOSIS — Z79899 Other long term (current) drug therapy: Secondary | ICD-10-CM | POA: Insufficient documentation

## 2024-04-03 LAB — COMPREHENSIVE METABOLIC PANEL WITH GFR
ALT: 25 U/L (ref 0–44)
AST: 24 U/L (ref 15–41)
Albumin: 4.4 g/dL (ref 3.5–5.0)
Alkaline Phosphatase: 73 U/L (ref 38–126)
Anion gap: 7 (ref 5–15)
BUN: 14 mg/dL (ref 6–20)
CO2: 29 mmol/L (ref 22–32)
Calcium: 10.2 mg/dL (ref 8.9–10.3)
Chloride: 104 mmol/L (ref 98–111)
Creatinine, Ser: 0.46 mg/dL (ref 0.44–1.00)
GFR, Estimated: >60 mL/min (ref >60)
Glucose, Bld: 95 mg/dL (ref 70–99)
Potassium: 3.8 mmol/L (ref 3.5–5.1)
Sodium: 140 mmol/L (ref 135–145)
Total Bilirubin: 0.2 mg/dL (ref 0.0–1.2)
Total Protein: 8.1 g/dL (ref 6.5–8.1)

## 2024-04-03 LAB — CBC WITH DIFFERENTIAL/PLATELET
Abs Immature Granulocytes: 0.01 K/uL (ref 0.00–0.07)
Basophils Absolute: 0 K/uL (ref 0.0–0.1)
Basophils Relative: 1 %
Eosinophils Absolute: 0.2 K/uL (ref 0.0–0.5)
Eosinophils Relative: 3 %
HCT: 37.4 % (ref 36.0–46.0)
Hemoglobin: 12.2 g/dL (ref 12.0–15.0)
Immature Granulocytes: 0 %
Lymphocytes Relative: 28 %
Lymphs Abs: 1.6 K/uL (ref 0.7–4.0)
MCH: 27.1 pg (ref 26.0–34.0)
MCHC: 32.6 g/dL (ref 30.0–36.0)
MCV: 82.9 fL (ref 80.0–100.0)
Monocytes Absolute: 0.5 K/uL (ref 0.1–1.0)
Monocytes Relative: 9 %
Neutro Abs: 3.4 K/uL (ref 1.7–7.7)
Neutrophils Relative %: 59 %
Platelets: 288 K/uL (ref 150–400)
RBC: 4.51 MIL/uL (ref 3.87–5.11)
RDW: 12.6 % (ref 11.5–15.5)
WBC: 5.7 K/uL (ref 4.0–10.5)
nRBC: 0 % (ref 0.0–0.2)

## 2024-04-03 LAB — IRON AND IRON BINDING CAPACITY (CC-WL,HP ONLY)
Iron: 101 ug/dL (ref 28–170)
Saturation Ratios: 26 % (ref 10.4–31.8)
TIBC: 395 ug/dL (ref 250–450)
UIBC: 294 ug/dL (ref 148–442)

## 2024-04-03 LAB — FERRITIN: Ferritin: 142 ng/mL (ref 11–307)

## 2024-04-03 MED ORDER — LEUPROLIDE ACETATE (3 MONTH) 11.25 MG IM KIT
11.2500 mg | PACK | Freq: Once | INTRAMUSCULAR | Status: AC
  Administered 2024-04-03: 11.25 mg via INTRAMUSCULAR
  Filled 2024-04-03: qty 11.25

## 2024-04-03 MED ORDER — INFLUENZA VIRUS VACC SPLIT PF (FLUZONE) 0.5 ML IM SUSY
0.5000 mL | PREFILLED_SYRINGE | Freq: Once | INTRAMUSCULAR | Status: AC
  Administered 2024-04-03: 0.5 mL via INTRAMUSCULAR
  Filled 2024-04-03: qty 0.5

## 2024-04-03 NOTE — Assessment & Plan Note (Addendum)
 The patient is not able to give consent Her mother is the principal caregiver She has consented for Lupron  injection today

## 2024-04-03 NOTE — Progress Notes (Signed)
    [facility] OFF[facility] PROGRESS [facility]  Coccaro, Maude PARAS, MD  ASSESSMENT & PLAN:  Assessment & Plan Iron  deficiency anemia due to chronic blood loss The patient had severe iron  deficiency anemia due to menorrhagia She received intravenous iron  infusion and Lupron  injection to stop her menstrual cycle She is no longer anemic since March 2025 She does not need intravenous iron  infusion After long discussion with her mother, we will continue Lupron  injection every 3 months Moderate intellectual disabilities The patient is not able to give consent Her mother is the principal caregiver She has consented for Lupron  injection today    No orders of the defined types were placed in this encounter.   INTERVAL HISTORY: Patient returns for recurrent anemia Symptoms of anemia includes none We reviewed CBC result today  SUMMARY OF HEMATOLOGIC HISTORY:  She was found to have abnormal CBC from recent blood work On May 12, 2023, she was noted to have hemoglobin of 7.1, MCV of 66, ferritin of 7 and 2% saturation Repeat labs today showed worsening anemia Due to her intellectual disability, her mother provided majority of the history She has noted significant heavy menstruation over the past few months Unable to assess for symptoms of anemia No other signs of bleeding.  Her mother has been giving her intermittent doses of ibuprofen or acetaminophen  as needed for pain She had no prior history or diagnosis of cancer. Her age appropriate screening programs are up-to-date. During the time of her menstrual cycle, the patient is noted to have poor oral intake She is not known to have received blood transfusion in the past.  She had history of seizures but no seizures in the past few years She was recently prescribed oral contraceptives to help with abnormal menstrual cycle.  She just completed menstrual cycle yesterday On May 17, 2023, she received blood, IV iron  and Lupron   injection Starting 2025, the patient has not needed blood IV iron .  She received Lupron  injection every 3 months to stop her menstrual cycle  Lab Results  Component Value Date   FERRITIN 160 11/25/2023   HGB 12.2 04/03/2024   RBC 4.51 04/03/2024   Vitals:   04/03/24 1105  Pulse: (!) 52  Resp: 17  Temp: 99 F (37.2 C)  SpO2: 96%

## 2024-04-03 NOTE — Assessment & Plan Note (Addendum)
 The patient had severe iron  deficiency anemia due to menorrhagia She received intravenous iron  infusion and Lupron  injection to stop her menstrual cycle She is no longer anemic since March 2025 She does not need intravenous iron  infusion After long discussion with her mother, we will continue Lupron  injection every 3 months

## 2024-06-30 ENCOUNTER — Telehealth: Payer: Self-pay

## 2024-06-30 NOTE — Telephone Encounter (Signed)
 Called and reminded of appt on 2/13. Mom is aware of appt.

## 2024-06-30 NOTE — Telephone Encounter (Signed)
-----   Message from Almarie Bedford, MD sent at 06/30/2024  2:05 PM EST ----- Pls call and remind her mother of her appt next week

## 2024-07-04 ENCOUNTER — Inpatient Hospital Stay: Payer: MEDICAID | Admitting: Hematology and Oncology

## 2024-07-04 ENCOUNTER — Encounter: Payer: Self-pay | Admitting: Hematology and Oncology

## 2024-07-04 ENCOUNTER — Inpatient Hospital Stay: Payer: MEDICAID

## 2024-07-04 ENCOUNTER — Inpatient Hospital Stay: Payer: MEDICAID | Attending: Hematology and Oncology

## 2024-07-04 VITALS — HR 92 | Temp 99.2°F | Resp 20

## 2024-07-04 DIAGNOSIS — D500 Iron deficiency anemia secondary to blood loss (chronic): Secondary | ICD-10-CM | POA: Insufficient documentation

## 2024-07-04 DIAGNOSIS — D75838 Other thrombocytosis: Secondary | ICD-10-CM

## 2024-07-04 DIAGNOSIS — Z79899 Other long term (current) drug therapy: Secondary | ICD-10-CM | POA: Diagnosis not present

## 2024-07-04 DIAGNOSIS — N924 Excessive bleeding in the premenopausal period: Secondary | ICD-10-CM

## 2024-07-04 DIAGNOSIS — N920 Excessive and frequent menstruation with regular cycle: Secondary | ICD-10-CM | POA: Diagnosis present

## 2024-07-04 LAB — COMPREHENSIVE METABOLIC PANEL WITH GFR
ALT: 61 U/L — ABNORMAL HIGH (ref 0–44)
AST: 60 U/L — ABNORMAL HIGH (ref 15–41)
Albumin: 4.4 g/dL (ref 3.5–5.0)
Alkaline Phosphatase: 108 U/L (ref 38–126)
Anion gap: 13 (ref 5–15)
BUN: 11 mg/dL (ref 6–20)
CO2: 24 mmol/L (ref 22–32)
Calcium: 9.7 mg/dL (ref 8.9–10.3)
Chloride: 103 mmol/L (ref 98–111)
Creatinine, Ser: 0.48 mg/dL (ref 0.44–1.00)
GFR, Estimated: >60 mL/min
Glucose, Bld: 72 mg/dL (ref 70–99)
Potassium: 4.5 mmol/L (ref 3.5–5.1)
Sodium: 139 mmol/L (ref 135–145)
Total Bilirubin: <0.2 mg/dL (ref 0.0–1.2)
Total Protein: 8.3 g/dL — ABNORMAL HIGH (ref 6.5–8.1)

## 2024-07-04 LAB — CBC WITH DIFFERENTIAL/PLATELET
Abs Immature Granulocytes: 0.01 K/uL (ref 0.00–0.07)
Basophils Absolute: 0 K/uL (ref 0.0–0.1)
Basophils Relative: 1 %
Eosinophils Absolute: 0.1 K/uL (ref 0.0–0.5)
Eosinophils Relative: 2 %
HCT: 37.7 % (ref 36.0–46.0)
Hemoglobin: 12.2 g/dL (ref 12.0–15.0)
Immature Granulocytes: 0 %
Lymphocytes Relative: 40 %
Lymphs Abs: 2.2 K/uL (ref 0.7–4.0)
MCH: 26.9 pg (ref 26.0–34.0)
MCHC: 32.4 g/dL (ref 30.0–36.0)
MCV: 83.2 fL (ref 80.0–100.0)
Monocytes Absolute: 0.6 K/uL (ref 0.1–1.0)
Monocytes Relative: 11 %
Neutro Abs: 2.5 K/uL (ref 1.7–7.7)
Neutrophils Relative %: 46 %
Platelets: 278 K/uL (ref 150–400)
RBC: 4.53 MIL/uL (ref 3.87–5.11)
RDW: 12.6 % (ref 11.5–15.5)
WBC: 5.5 K/uL (ref 4.0–10.5)
nRBC: 0 % (ref 0.0–0.2)

## 2024-07-04 LAB — IRON AND IRON BINDING CAPACITY (CC-WL,HP ONLY)
Iron: 111 ug/dL (ref 28–170)
Saturation Ratios: 30 % (ref 10.4–31.8)
TIBC: 368 ug/dL (ref 250–450)
UIBC: 257 ug/dL

## 2024-07-04 LAB — FERRITIN: Ferritin: 121 ng/mL (ref 11–307)

## 2024-07-04 MED ORDER — LEUPROLIDE ACETATE (3 MONTH) 11.25 MG IM KIT
11.2500 mg | PACK | Freq: Once | INTRAMUSCULAR | Status: AC
  Administered 2024-07-04: 11.25 mg via INTRAMUSCULAR
  Filled 2024-07-04: qty 11.25

## 2024-07-04 NOTE — Assessment & Plan Note (Addendum)
 The patient had history of severe iron  deficiency anemia due to menorrhagia She received intravenous iron  infusion and Lupron  injection to stop her menstrual cycle She is no longer anemic since March 2025 She has no menstrual cycle since we started Lupron  injection She does not need intravenous iron  infusion We discussed risk and benefits of IUD placement versus hysterectomy versus continuing on Lupron  every 3 months After long discussion with her mother, we will continue Lupron  injection every 3 months for now

## 2024-07-04 NOTE — Progress Notes (Signed)
" ° °   [facility] OFFICE PROGRESS NOTE  Coccaro, Maude PARAS, MD  ASSESSMENT & PLAN:  Assessment & Plan Iron  deficiency anemia due to chronic blood loss The patient had history of severe iron  deficiency anemia due to menorrhagia She received intravenous iron  infusion and Lupron  injection to stop her menstrual cycle She is no longer anemic since March 2025 She has no menstrual cycle since we started Lupron  injection She does not need intravenous iron  infusion We discussed risk and benefits of IUD placement versus hysterectomy versus continuing on Lupron  every 3 months After long discussion with her mother, we will continue Lupron  injection every 3 months for now    No orders of the defined types were placed in this encounter.   INTERVAL HISTORY: Patient returns for recurrent anemia Symptoms of anemia includes none We reviewed CBC and iron  studies  SUMMARY OF HEMATOLOGIC HISTORY:  She was found to have abnormal CBC from recent blood work On May 12, 2023, she was noted to have hemoglobin of 7.1, MCV of 66, ferritin of 7 and 2% saturation Repeat labs today showed worsening anemia Due to her intellectual disability, her mother provided majority of the history She has noted significant heavy menstruation over the past few months Unable to assess for symptoms of anemia No other signs of bleeding.  Her mother has been giving her intermittent doses of ibuprofen or acetaminophen  as needed for pain She had no prior history or diagnosis of cancer. Her age appropriate screening programs are up-to-date. During the time of her menstrual cycle, the patient is noted to have poor oral intake She is not known to have received blood transfusion in the past.  She had history of seizures but no seizures in the past few years She was recently prescribed oral contraceptives to help with abnormal menstrual cycle.  She just completed menstrual cycle yesterday On May 17, 2023, she  received blood, IV iron  and Lupron  injection Starting 2025, the patient has not needed blood IV iron .  She received Lupron  injection every 3 months to stop her menstrual cycle  Lab Results  Component Value Date   FERRITIN 121 07/04/2024   HGB 12.2 07/04/2024   RBC 4.53 07/04/2024   Vitals:   07/04/24 1109  Pulse: 92  Resp: 20  Temp: 99.2 F (37.3 C)  SpO2: 96%   "

## 2024-07-04 NOTE — Patient Instructions (Signed)
 Leuprolide Solution for Injection What is this medication? LEUPROLIDE (loo PROE lide) reduces the symptoms of prostate cancer. It works by decreasing levels of the hormone testosterone in the body. This prevents prostate cancer cells from spreading or growing. This medicine may be used for other purposes; ask your health care provider or pharmacist if you have questions. COMMON BRAND NAME(S): Lupron What should I tell my care team before I take this medication? They need to know if you have any of these conditions: Diabetes Heart attack Heart disease High blood pressure High cholesterol Pain or difficulty passing urine Spinal cord metastasis Stroke Tobacco use An unusual or allergic reaction to leuprolide, other medications, foods, dyes, or preservatives Pregnant or trying to get pregnant Breast-feeding How should I use this medication? This medication is for injection under the skin or into a muscle. You will be taught how to prepare and give this medication. Use exactly as directed. Take your medication at regular intervals. Do not take it more often than directed. It is important that you put your used needles and syringes in a special sharps container. Do not put them in a trash can. If you do not have a sharps container, call your care team to get one. A special MedGuide will be given to you by the pharmacist with each prescription and refill. Be sure to read this information carefully each time. Talk to your care team about the use of this medication in children. While this medication may be prescribed for children as young as 8 years for selected conditions, precautions do apply. Overdosage: If you think you have taken too much of this medicine contact a poison control center or emergency room at once. NOTE: This medicine is only for you. Do not share this medicine with others. What if I miss a dose? If you miss a dose, take it as soon as you can. If it is almost time for your next  dose, take only that dose. Do not take double or extra doses. What may interact with this medication? Do not take this medication with any of the following: Chasteberry Cisapride Dronedarone Pimozide Thioridazine This medication may also interact with the following: Estrogen or progestin hormones Herbal or dietary supplements, like black cohosh or DHEA Other medications that cause heart rhythm changes Testosterone This list may not describe all possible interactions. Give your health care provider a list of all the medicines, herbs, non-prescription drugs, or dietary supplements you use. Also tell them if you smoke, drink alcohol, or use illegal drugs. Some items may interact with your medicine. What should I watch for while using this medication? Visit your care team for regular checks on your progress. During the first week, your symptoms may get worse, but then will improve as you continue your treatment. You may get hot flashes, increased bone pain, increased difficulty passing urine, or an aggravation of nerve symptoms. Discuss these effects with your care team, some of them may improve with continued use of this medication. Patients may experience a menstrual cycle or spotting during the first 2 months of therapy with this medication. If this continues, contact your care team. This medication may increase blood sugar. The risk may be higher in patients who already have diabetes. Ask your care team what you can do to lower your risk of diabetes while taking this medication. What side effects may I notice from receiving this medication? Side effects that you should report to your care team as soon as possible: Allergic reactions--skin rash,  itching, hives, swelling of the face, lips, tongue, or throat Heart attack--pain or tightness in the chest, shoulders, arms, or jaw, nausea, shortness of breath, cold or clammy skin, feeling faint or lightheaded Heart rhythm changes--fast or irregular  heartbeat, dizziness, feeling faint or lightheaded, chest pain, trouble breathing High blood sugar (hyperglycemia)--increased thirst or amount of urine, unusual weakness or fatigue, blurry vision New or worsening seizures Redness, blistering, peeling, or loosening of the skin, including inside the mouth Stroke--sudden numbness or weakness of the face, arm, or leg, trouble speaking, confusion, trouble walking, loss of balance or coordination, dizziness, severe headache, change in vision Swelling and pain of the tumor site or lymph nodes Side effects that usually do not require medical attention (report these to your care team if they continue or are bothersome): Change in sex drive or performance Hot flashes Joint pain Pain, redness, or irritation at injection site Swelling of the ankles, hands, or feet Unusual weakness or fatigue This list may not describe all possible side effects. Call your doctor for medical advice about side effects. You may report side effects to FDA at 1-800-FDA-1088. Where should I keep my medication? Keep out of the reach of children and pets. Store below 25 degrees C (77 degrees F). Do not freeze. Protect from light. Get rid of any unused medication after the expiration date. To get rid of medications that are no longer needed or have expired: Take the medication to a medication take-back program. Check with your pharmacy or law enforcement to find a location. If you cannot return the medication, ask your pharmacist or care team how to get rid of this medication safely. NOTE: This sheet is a summary. It may not cover all possible information. If you have questions about this medicine, talk to your doctor, pharmacist, or health care provider.  2024 [employer] (2023-05-21 00:00:00)

## 2024-10-03 ENCOUNTER — Inpatient Hospital Stay: Payer: MEDICAID

## 2024-10-03 ENCOUNTER — Inpatient Hospital Stay: Payer: MEDICAID | Admitting: Hematology and Oncology
